# Patient Record
Sex: Male | Born: 1955 | Race: White | Smoking: Former smoker
Health system: Southern US, Community
[De-identification: ages and names within clinical notes are randomized; demographics above are authoritative.]

## PROBLEM LIST (undated history)

## (undated) DIAGNOSIS — I10 Essential (primary) hypertension: Secondary | ICD-10-CM

---

## 2008-09-23 ENCOUNTER — Emergency Department: Payer: Self-pay | Admitting: Emergency Medicine

## 2010-07-18 ENCOUNTER — Ambulatory Visit: Payer: Self-pay | Admitting: Gastroenterology

## 2011-03-09 ENCOUNTER — Other Ambulatory Visit: Payer: Self-pay | Admitting: Occupational Medicine

## 2011-03-09 ENCOUNTER — Ambulatory Visit: Payer: Self-pay

## 2011-03-09 DIAGNOSIS — R52 Pain, unspecified: Secondary | ICD-10-CM

## 2014-01-27 ENCOUNTER — Emergency Department: Payer: Self-pay | Admitting: Student

## 2014-01-27 LAB — CBC
HCT: 43.8 % (ref 40.0–52.0)
HGB: 15.2 g/dL (ref 13.0–18.0)
MCH: 32.5 pg (ref 26.0–34.0)
MCHC: 34.6 g/dL (ref 32.0–36.0)
MCV: 94 fL (ref 80–100)
PLATELETS: 228 10*3/uL (ref 150–440)
RBC: 4.67 10*6/uL (ref 4.40–5.90)
RDW: 13 % (ref 11.5–14.5)
WBC: 8.1 10*3/uL (ref 3.8–10.6)

## 2014-01-27 LAB — BASIC METABOLIC PANEL
ANION GAP: 7 (ref 7–16)
BUN: 18 mg/dL (ref 7–18)
CALCIUM: 9.4 mg/dL (ref 8.5–10.1)
CHLORIDE: 104 mmol/L (ref 98–107)
Co2: 28 mmol/L (ref 21–32)
Creatinine: 0.66 mg/dL (ref 0.60–1.30)
EGFR (African American): >60
EGFR (Non-African Amer.): >60
Glucose: 105 mg/dL — ABNORMAL HIGH (ref 65–99)
OSMOLALITY: 280 (ref 275–301)
Potassium: 3.6 mmol/L (ref 3.5–5.1)
Sodium: 139 mmol/L (ref 136–145)

## 2014-01-27 LAB — TROPONIN I: Troponin-I: <0.02 ng/mL

## 2015-03-27 IMAGING — CR DG CHEST 1V PORT
1 series · 1 of 1 positions shown · non-contrast
Comparison: Chest x-ray 09/24/2008.

CLINICAL DATA: Palpitations.

EXAM:
PORTABLE CHEST - 1 VIEW

[ap]
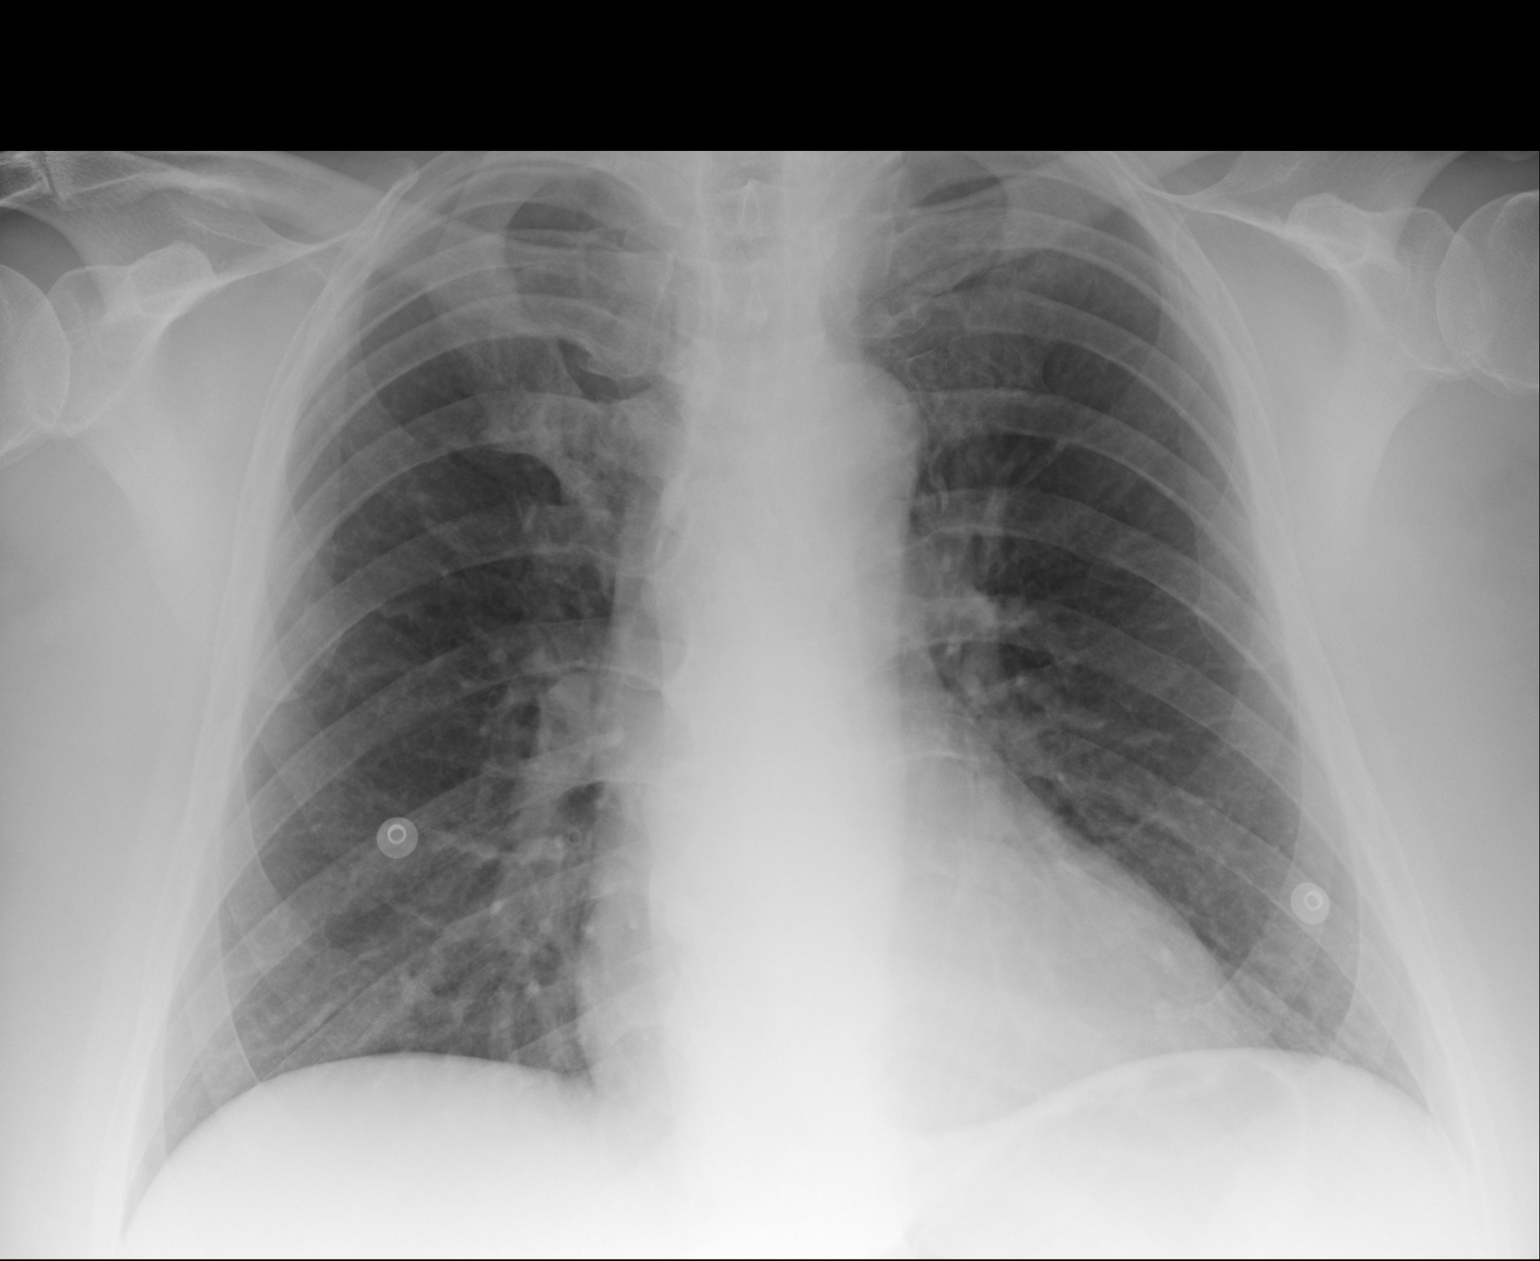

[1 of 1 positions shown; findings below may reference images not displayed]

FINDINGS: Lung volumes are normal. No consolidative airspace disease. No
pleural effusions. No pneumothorax. No pulmonary nodule or mass
noted. Pulmonary vasculature and the cardiomediastinal silhouette
are within normal limits.
IMPRESSION: No radiographic evidence of acute cardiopulmonary disease.

## 2017-01-17 ENCOUNTER — Encounter: Payer: Self-pay | Admitting: Emergency Medicine

## 2017-01-17 ENCOUNTER — Ambulatory Visit
Admission: EM | Admit: 2017-01-17 | Discharge: 2017-01-17 | Disposition: A | Discharge status: Home / Self Care | Payer: Managed Care, Other (non HMO) | Attending: Family Medicine | Admitting: Family Medicine

## 2017-01-17 DIAGNOSIS — M5481 Occipital neuralgia: Secondary | ICD-10-CM

## 2017-01-17 DIAGNOSIS — R51 Headache: Secondary | ICD-10-CM

## 2017-01-17 HISTORY — DX: Essential (primary) hypertension: I10

## 2017-01-17 MED ORDER — MELOXICAM 15 MG PO TABS: 15.0000 mg | ORAL_TABLET | Freq: Every day | ORAL | 0 refills | Status: AC

## 2017-01-17 MED ORDER — PREDNISONE 10 MG (21) PO TBPK: ORAL_TABLET | ORAL | 0 refills | Status: AC

## 2017-01-17 NOTE — ED Provider Notes (Signed)
[facility]    CSN: [id number] Arrival date & time: 01/17/17  1615     History   Chief Complaint Chief Complaint  Patient presents with  . Headache    HPI Kindrick Lankford is a 61 y.o. male.   Patient is a 61 year old white male who presents because of swelling in the right back of his head. He states he doesn't have much of a headache persists some soreness back. About a week ago he knows no removed his biological or problems with that tic. That is much inferior neck compared to the tenderness he has over his right lower skull. He denies any trauma to the area. He is a former smoker. He is allergic to sulfur and tramadol and he has a history hypertension. No pertinent family medical history relevant to today's visit.   The history is provided by the patient and the spouse. No language interpreter was used.  Headache  Pain location:  Occipital Quality:  Dull Radiates to:  Does not radiate Onset quality:  Sudden Timing:  Intermittent Chronicity:  New Similar to prior headaches: no   Worsened by:  Nothing Ineffective treatments:  None tried   Past Medical History:  Diagnosis Date  . Hypertension     There are no active problems to display for this patient.   History reviewed. No pertinent surgical history.     Home Medications    Prior to Admission medications   Medication Sig Start Date End Date Taking? Authorizing Provider  amLODipine (NORVASC) 10 MG tablet Take 10 mg by mouth daily.   Yes [provider]  aspirin EC 81 MG tablet Take 81 mg by mouth daily.   Yes [provider]  atorvastatin (LIPITOR) 10 MG tablet Take 10 mg by mouth daily.   Yes [provider]  benazepril (LOTENSIN) 5 MG tablet Take 5 mg by mouth daily.   Yes [provider]  co-enzyme Q-10 30 MG capsule Take 30 mg by mouth 3 (three) times daily.   Yes [provider]  meloxicam (MOBIC) 15 MG tablet Take 1 tablet (15 mg total) by mouth  daily. 01/17/17   Hassan Rowan, MD  predniSONE (STERAPRED UNI-PAK 21 TAB) 10 MG (21) TBPK tablet Sig 6 tablet day 1, 5 tablets day 2, 4 tablets day 3,,3tablets day 4, 2 tablets day 5, 1 tablet day 6 take all tablets orally 01/17/17   Hassan Rowan, MD    Family History History reviewed. No pertinent family history.  Social History Social History  Substance Use Topics  . Smoking status: Former Games developer  . Smokeless tobacco: Never Used  . Alcohol use No     Allergies   Sulfa antibiotics and Tramadol   Review of Systems Review of Systems  Neurological: Positive for headaches.  All other systems reviewed and are negative.    Physical Exam Triage Vital Signs ED Triage Vitals  Enc Vitals Group     BP 01/17/17 1636 (!) 158/77     Pulse Rate 01/17/17 1636 71     Resp 01/17/17 1636 16     Temp 01/17/17 1636 98.3 F (36.8 C)     Temp Source 01/17/17 1636 Oral     SpO2 01/17/17 1636 100 %     Weight 01/17/17 1633 240 lb (108.9 kg)     Height 01/17/17 1633 6' (1.829 m)     Head Circumference --      Peak Flow --      Pain Score 01/17/17  1633 1     Pain Loc --      Pain Edu? --      Excl. in GC? --    No data found.   Updated Vital Signs BP (!) 158/77 (BP Location: Left Arm)   Pulse 71   Temp 98.3 F (36.8 C) (Oral)   Resp 16   Ht 6' (1.829 m)   Wt 240 lb (108.9 kg)   SpO2 100%   BMI 32.55 kg/m   Visual Acuity Right Eye Distance:   Left Eye Distance:   Bilateral Distance:    Right Eye Near:   Left Eye Near:    Bilateral Near:     Physical Exam  Constitutional: He is oriented to person, place, and time. He appears well-developed and well-nourished.  HENT:  Head: Normocephalic and atraumatic.    Right Ear: Hearing, tympanic membrane, external ear and ear canal normal.  Left Ear: Hearing, tympanic membrane, external ear and ear canal normal.  Nose: Nose normal. Right sinus exhibits no maxillary sinus tenderness and no frontal sinus tenderness. Left sinus  exhibits no maxillary sinus tenderness and no frontal sinus tenderness.  Mouth/Throat: Uvula is midline and oropharynx is clear and moist.  There is some swelling at the base of the occipital area on the right this tenderness over the occipital foramen consistent with occipital neuritis  Eyes: Pupils are equal, round, and reactive to light. Conjunctivae and EOM are normal.  Neck: Normal range of motion. Neck supple.  Pulmonary/Chest: Effort normal.  Musculoskeletal: Normal range of motion.  Neurological: He is alert and oriented to person, place, and time.  Skin: Skin is warm.  Vitals reviewed.    UC Treatments / Results  Labs (all labs ordered are listed, but only abnormal results are displayed) Labs Reviewed - No data to display  EKG  EKG Interpretation None       Radiology No results found.  Procedures Procedures (including critical care time)  Medications Ordered in UC Medications - No data to display   Initial Impression / Assessment and Plan / UC Course  I have reviewed the triage vital signs and the nursing notes.  Pertinent labs & imaging results that were available during my care of the patient were reviewed by me and considered in my medical decision making (see chart for details).     Think patient has occipital neuritis the tick bite think is incidental occurred last week [facility] but he declined since he is not having any symptoms of both eyes are generalized myalgia or other systemic problems. What he has is localized tenderness over the occipital foramen consistent with occipital neuritis.: Recommend replacement Mobic 15 mg 1 tablet a day and improving with 6 a course of prednisone to try to help reduce swelling but I have explained to him and his wife often occipital neuritis has been treated with a cortisone injection at the base of skull where the blood vessels and nerve emerge. He really wants us to be conservative at  this time so we'll try him on the oral prednisone. I recommend follow-up with his PCP 3 weeks for possible injection referral to pain clinic if this does not go down far swelling is concerned  Final Clinical Impressions(s) / UC Diagnoses   Final diagnoses:  Occipital neuralgia of right side    New Prescriptions New Prescriptions   MELOXICAM (MOBIC) 15 MG TABLET    Take 1 tablet (15 mg total) by mouth  daily.   PREDNISONE (STERAPRED UNI-PAK 21 TAB) 10 MG (21) TBPK TABLET    Sig 6 tablet day 1, 5 tablets day 2, 4 tablets day 3,,3tablets day 4, 2 tablets day 5, 1 tablet day 6 take all tablets orally     Hassan RowanWade, Kieley Akter, MD 01/17/17 1747

## 2017-01-17 NOTE — ED Triage Notes (Signed)
Patient states that he removed a tick from his neck a week ago.  Patient reports HAs and swelling at the back of his neck today.
